# Patient Record
Sex: Male | Born: 2000 | Hispanic: No | Marital: Single | Smoking: Never smoker
Health system: Southern US, Community
[De-identification: ages and names within clinical notes are randomized; demographics above are authoritative.]

## PROBLEM LIST (undated history)

## (undated) DIAGNOSIS — F419 Anxiety disorder, unspecified: Secondary | ICD-10-CM

## (undated) DIAGNOSIS — Z87442 Personal history of urinary calculi: Secondary | ICD-10-CM

## (undated) HISTORY — PX: WISDOM TOOTH EXTRACTION: SHX21

## (undated) HISTORY — PX: HERNIA REPAIR: SHX51

---
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
## 2019-02-13 DIAGNOSIS — Z20828 Contact with and (suspected) exposure to other viral communicable diseases: Secondary | ICD-10-CM | POA: Diagnosis not present

## 2019-02-21 DIAGNOSIS — Z23 Encounter for immunization: Secondary | ICD-10-CM | POA: Diagnosis not present

## 2019-04-27 DIAGNOSIS — J019 Acute sinusitis, unspecified: Secondary | ICD-10-CM | POA: Diagnosis not present

## 2019-04-27 DIAGNOSIS — H6123 Impacted cerumen, bilateral: Secondary | ICD-10-CM | POA: Diagnosis not present

## 2019-04-27 DIAGNOSIS — Z20828 Contact with and (suspected) exposure to other viral communicable diseases: Secondary | ICD-10-CM | POA: Diagnosis not present

## 2019-06-30 ENCOUNTER — Other Ambulatory Visit: Payer: Self-pay

## 2019-06-30 DIAGNOSIS — Z20822 Contact with and (suspected) exposure to covid-19: Secondary | ICD-10-CM

## 2019-07-03 LAB — NOVEL CORONAVIRUS, NAA: SARS-CoV-2, NAA: NOT DETECTED

## 2019-08-14 DIAGNOSIS — Z20828 Contact with and (suspected) exposure to other viral communicable diseases: Secondary | ICD-10-CM | POA: Diagnosis not present

## 2019-08-16 DIAGNOSIS — Z20828 Contact with and (suspected) exposure to other viral communicable diseases: Secondary | ICD-10-CM | POA: Diagnosis not present

## 2019-12-25 DIAGNOSIS — J40 Bronchitis, not specified as acute or chronic: Secondary | ICD-10-CM | POA: Diagnosis not present

## 2019-12-25 DIAGNOSIS — J329 Chronic sinusitis, unspecified: Secondary | ICD-10-CM | POA: Diagnosis not present

## 2020-01-24 DIAGNOSIS — Z20822 Contact with and (suspected) exposure to covid-19: Secondary | ICD-10-CM | POA: Diagnosis not present

## 2020-04-09 DIAGNOSIS — Z0000 Encounter for general adult medical examination without abnormal findings: Secondary | ICD-10-CM | POA: Diagnosis not present

## 2020-04-29 DIAGNOSIS — J310 Chronic rhinitis: Secondary | ICD-10-CM | POA: Diagnosis not present

## 2020-04-29 DIAGNOSIS — H6123 Impacted cerumen, bilateral: Secondary | ICD-10-CM | POA: Diagnosis not present

## 2020-04-29 DIAGNOSIS — J353 Hypertrophy of tonsils with hypertrophy of adenoids: Secondary | ICD-10-CM | POA: Diagnosis not present

## 2020-04-29 DIAGNOSIS — J343 Hypertrophy of nasal turbinates: Secondary | ICD-10-CM | POA: Diagnosis not present

## 2020-04-29 DIAGNOSIS — J320 Chronic maxillary sinusitis: Secondary | ICD-10-CM | POA: Diagnosis not present

## 2020-06-24 ENCOUNTER — Other Ambulatory Visit: Payer: Self-pay | Admitting: Otolaryngology

## 2020-06-24 DIAGNOSIS — J351 Hypertrophy of tonsils: Secondary | ICD-10-CM | POA: Diagnosis not present

## 2020-06-24 DIAGNOSIS — R5383 Other fatigue: Secondary | ICD-10-CM | POA: Diagnosis not present

## 2020-06-24 DIAGNOSIS — R7989 Other specified abnormal findings of blood chemistry: Secondary | ICD-10-CM | POA: Diagnosis not present

## 2020-06-24 DIAGNOSIS — J3503 Chronic tonsillitis and adenoiditis: Secondary | ICD-10-CM | POA: Diagnosis not present

## 2020-06-24 DIAGNOSIS — J353 Hypertrophy of tonsils with hypertrophy of adenoids: Secondary | ICD-10-CM | POA: Diagnosis not present

## 2020-07-01 ENCOUNTER — Other Ambulatory Visit: Payer: Self-pay

## 2020-07-01 ENCOUNTER — Encounter (HOSPITAL_BASED_OUTPATIENT_CLINIC_OR_DEPARTMENT_OTHER): Payer: Self-pay | Admitting: Otolaryngology

## 2020-07-04 ENCOUNTER — Other Ambulatory Visit (HOSPITAL_COMMUNITY): Payer: BC Managed Care – PPO

## 2020-07-05 ENCOUNTER — Other Ambulatory Visit (HOSPITAL_COMMUNITY)
Admission: RE | Admit: 2020-07-05 | Discharge: 2020-07-05 | Disposition: A | Discharge status: Home / Self Care | Payer: BC Managed Care – PPO | Source: Ambulatory Visit | Attending: Otolaryngology | Admitting: Otolaryngology

## 2020-07-05 DIAGNOSIS — Z20822 Contact with and (suspected) exposure to covid-19: Secondary | ICD-10-CM | POA: Insufficient documentation

## 2020-07-05 DIAGNOSIS — Z01812 Encounter for preprocedural laboratory examination: Secondary | ICD-10-CM | POA: Insufficient documentation

## 2020-07-05 DIAGNOSIS — J3501 Chronic tonsillitis: Secondary | ICD-10-CM | POA: Diagnosis present

## 2020-07-05 DIAGNOSIS — J312 Chronic pharyngitis: Secondary | ICD-10-CM | POA: Diagnosis not present

## 2020-07-05 LAB — SARS CORONAVIRUS 2 (TAT 6-24 HRS): SARS Coronavirus 2: NEGATIVE

## 2020-07-08 ENCOUNTER — Encounter (HOSPITAL_BASED_OUTPATIENT_CLINIC_OR_DEPARTMENT_OTHER): Payer: Self-pay | Admitting: Otolaryngology

## 2020-07-08 ENCOUNTER — Encounter (HOSPITAL_BASED_OUTPATIENT_CLINIC_OR_DEPARTMENT_OTHER)
Admission: RE | Disposition: A | Discharge status: Home / Self Care | Payer: Self-pay | Source: Home / Self Care | Attending: Otolaryngology

## 2020-07-08 ENCOUNTER — Ambulatory Visit (HOSPITAL_BASED_OUTPATIENT_CLINIC_OR_DEPARTMENT_OTHER): Payer: BC Managed Care – PPO | Admitting: Anesthesiology

## 2020-07-08 ENCOUNTER — Ambulatory Visit (HOSPITAL_BASED_OUTPATIENT_CLINIC_OR_DEPARTMENT_OTHER)
Admission: RE | Admit: 2020-07-08 | Discharge: 2020-07-08 | Disposition: A | Discharge status: Home / Self Care | Payer: BC Managed Care – PPO | Attending: Otolaryngology | Admitting: Otolaryngology

## 2020-07-08 ENCOUNTER — Other Ambulatory Visit: Payer: Self-pay

## 2020-07-08 DIAGNOSIS — J312 Chronic pharyngitis: Secondary | ICD-10-CM | POA: Insufficient documentation

## 2020-07-08 DIAGNOSIS — F419 Anxiety disorder, unspecified: Secondary | ICD-10-CM | POA: Diagnosis not present

## 2020-07-08 DIAGNOSIS — J3501 Chronic tonsillitis: Secondary | ICD-10-CM | POA: Insufficient documentation

## 2020-07-08 DIAGNOSIS — J3503 Chronic tonsillitis and adenoiditis: Secondary | ICD-10-CM | POA: Diagnosis not present

## 2020-07-08 DIAGNOSIS — J353 Hypertrophy of tonsils with hypertrophy of adenoids: Secondary | ICD-10-CM | POA: Diagnosis not present

## 2020-07-08 DIAGNOSIS — Z20822 Contact with and (suspected) exposure to covid-19: Secondary | ICD-10-CM | POA: Insufficient documentation

## 2020-07-08 HISTORY — DX: Anxiety disorder, unspecified: F41.9

## 2020-07-08 HISTORY — PX: TONSILLECTOMY AND ADENOIDECTOMY: SHX28

## 2020-07-08 SURGERY — TONSILLECTOMY AND ADENOIDECTOMY
Anesthesia: General | Site: Throat | Laterality: Bilateral

## 2020-07-08 MED ORDER — PROMETHAZINE HCL 25 MG/ML IJ SOLN: 6.2500 mg | INTRAMUSCULAR | Status: DC | PRN

## 2020-07-08 MED ORDER — AMOXICILLIN 400 MG/5ML PO SUSR: 800.0000 mg | Freq: Two times a day (BID) | ORAL | 0 refills | Status: DC

## 2020-07-08 MED ORDER — DEXMEDETOMIDINE HCL 200 MCG/2ML IV SOLN
INTRAVENOUS | Status: DC | PRN
  Administered 2020-07-08: 8 ug via INTRAVENOUS
  Administered 2020-07-08: 12 ug via INTRAVENOUS

## 2020-07-08 MED ORDER — SUCCINYLCHOLINE CHLORIDE 20 MG/ML IJ SOLN
INTRAMUSCULAR | Status: DC | PRN
  Administered 2020-07-08: 160 mg via INTRAVENOUS

## 2020-07-08 MED ORDER — HYDROCODONE-ACETAMINOPHEN 7.5-325 MG/15ML PO SOLN: 15.0000 mL | ORAL | 0 refills | Status: AC | PRN

## 2020-07-08 MED ORDER — SODIUM CHLORIDE 0.9 % IR SOLN
Status: DC | PRN
  Administered 2020-07-08: 500 mL

## 2020-07-08 MED ORDER — LACTATED RINGERS IV SOLN: INTRAVENOUS | Status: DC

## 2020-07-08 MED ORDER — 0.9 % SODIUM CHLORIDE (POUR BTL) OPTIME
TOPICAL | Status: DC | PRN
  Administered 2020-07-08: 1000 mL

## 2020-07-08 MED ORDER — ONDANSETRON HCL 4 MG/2ML IJ SOLN
INTRAMUSCULAR | Status: DC | PRN
  Administered 2020-07-08: 4 mg via INTRAVENOUS

## 2020-07-08 MED ORDER — LIDOCAINE HCL (CARDIAC) PF 100 MG/5ML IV SOSY
PREFILLED_SYRINGE | INTRAVENOUS | Status: DC | PRN
  Administered 2020-07-08: 60 mg via INTRAVENOUS

## 2020-07-08 MED ORDER — MEPERIDINE HCL 25 MG/ML IJ SOLN: 6.2500 mg | INTRAMUSCULAR | Status: DC | PRN

## 2020-07-08 MED ORDER — MIDAZOLAM HCL 2 MG/2ML IJ SOLN
INTRAMUSCULAR | Status: AC
  Filled 2020-07-08: qty 2

## 2020-07-08 MED ORDER — DEXAMETHASONE SODIUM PHOSPHATE 4 MG/ML IJ SOLN
INTRAMUSCULAR | Status: DC | PRN
  Administered 2020-07-08: 10 mg via INTRAVENOUS

## 2020-07-08 MED ORDER — HYDROMORPHONE HCL 1 MG/ML IJ SOLN: 0.2500 mg | INTRAMUSCULAR | Status: DC | PRN

## 2020-07-08 MED ORDER — HYDROCODONE-ACETAMINOPHEN 7.5-325 MG/15ML PO SOLN: 15.0000 mL | ORAL | 0 refills | Status: DC | PRN

## 2020-07-08 MED ORDER — ROCURONIUM BROMIDE 100 MG/10ML IV SOLN
INTRAVENOUS | Status: DC | PRN
  Administered 2020-07-08: 10 mg via INTRAVENOUS

## 2020-07-08 MED ORDER — AMISULPRIDE (ANTIEMETIC) 5 MG/2ML IV SOLN: 10.0000 mg | Freq: Once | INTRAVENOUS | Status: DC | PRN

## 2020-07-08 MED ORDER — MIDAZOLAM HCL 5 MG/5ML IJ SOLN
INTRAMUSCULAR | Status: DC | PRN
  Administered 2020-07-08: 2 mg via INTRAVENOUS

## 2020-07-08 MED ORDER — OXYMETAZOLINE HCL 0.05 % NA SOLN
NASAL | Status: DC | PRN
  Administered 2020-07-08: 1 via TOPICAL

## 2020-07-08 MED ORDER — AMOXICILLIN 400 MG/5ML PO SUSR: 800.0000 mg | Freq: Two times a day (BID) | ORAL | 0 refills | Status: AC

## 2020-07-08 MED ORDER — OXYCODONE HCL 5 MG/5ML PO SOLN: 5.0000 mg | Freq: Once | ORAL | Status: DC | PRN

## 2020-07-08 MED ORDER — LIDOCAINE 2% (20 MG/ML) 5 ML SYRINGE
INTRAMUSCULAR | Status: AC
  Filled 2020-07-08: qty 5

## 2020-07-08 MED ORDER — PROPOFOL 10 MG/ML IV BOLUS
INTRAVENOUS | Status: AC
  Filled 2020-07-08: qty 20

## 2020-07-08 MED ORDER — DEXMEDETOMIDINE (PRECEDEX) IN NS 20 MCG/5ML (4 MCG/ML) IV SYRINGE
PREFILLED_SYRINGE | INTRAVENOUS | Status: AC
  Filled 2020-07-08: qty 5

## 2020-07-08 MED ORDER — OXYCODONE HCL 5 MG PO TABS: 5.0000 mg | ORAL_TABLET | Freq: Once | ORAL | Status: DC | PRN

## 2020-07-08 MED ORDER — FENTANYL CITRATE (PF) 100 MCG/2ML IJ SOLN
INTRAMUSCULAR | Status: DC | PRN
  Administered 2020-07-08: 100 ug via INTRAVENOUS

## 2020-07-08 MED ORDER — PROPOFOL 10 MG/ML IV BOLUS
INTRAVENOUS | Status: DC | PRN
  Administered 2020-07-08: 150 mg via INTRAVENOUS

## 2020-07-08 MED ORDER — FENTANYL CITRATE (PF) 100 MCG/2ML IJ SOLN
INTRAMUSCULAR | Status: AC
  Filled 2020-07-08: qty 2

## 2020-07-08 SURGICAL SUPPLY — 29 items
BNDG COHESIVE 2X5 TAN STRL LF (GAUZE/BANDAGES/DRESSINGS) IMPLANT
CANISTER SUCT 1200ML W/VALVE (MISCELLANEOUS) ×2 IMPLANT
CATH ROBINSON RED A/P 10FR (CATHETERS) IMPLANT
CATH ROBINSON RED A/P 14FR (CATHETERS) IMPLANT
COAGULATOR SUCT SWTCH 10FR 6 (ELECTROSURGICAL) IMPLANT
COVER BACK TABLE 60X90IN (DRAPES) ×2 IMPLANT
COVER MAYO STAND STRL (DRAPES) ×2 IMPLANT
COVER WAND RF STERILE (DRAPES) IMPLANT
ELECT REM PT RETURN 9FT ADLT (ELECTROSURGICAL)
ELECT REM PT RETURN 9FT PED (ELECTROSURGICAL)
ELECTRODE REM PT RETRN 9FT PED (ELECTROSURGICAL) IMPLANT
ELECTRODE REM PT RTRN 9FT ADLT (ELECTROSURGICAL) IMPLANT
GAUZE SPONGE 4X4 12PLY STRL LF (GAUZE/BANDAGES/DRESSINGS) ×2 IMPLANT
GLOVE BIO SURGEON STRL SZ7.5 (GLOVE) ×2 IMPLANT
GOWN STRL REUS W/ TWL LRG LVL3 (GOWN DISPOSABLE) ×2 IMPLANT
GOWN STRL REUS W/TWL LRG LVL3 (GOWN DISPOSABLE) ×4
IV NS 500ML (IV SOLUTION) ×2
IV NS 500ML BAXH (IV SOLUTION) ×1 IMPLANT
MARKER SKIN DUAL TIP RULER LAB (MISCELLANEOUS) IMPLANT
NS IRRIG 1000ML POUR BTL (IV SOLUTION) ×2 IMPLANT
SHEET MEDIUM DRAPE 40X70 STRL (DRAPES) ×2 IMPLANT
SOLUTION BUTLER CLEAR DIP (MISCELLANEOUS) ×2 IMPLANT
SPONGE TONSIL TAPE 1.25 RFD (DISPOSABLE) ×2 IMPLANT
SYR BULB EAR ULCER 3OZ GRN STR (SYRINGE) IMPLANT
TOWEL GREEN STERILE FF (TOWEL DISPOSABLE) ×2 IMPLANT
TUBE CONNECTING 20X1/4 (TUBING) ×2 IMPLANT
TUBE SALEM SUMP 12R W/ARV (TUBING) IMPLANT
TUBE SALEM SUMP 16 FR W/ARV (TUBING) IMPLANT
WAND COBLATOR 70 EVAC XTRA (SURGICAL WAND) ×2 IMPLANT

## 2020-07-08 NOTE — Op Note (Signed)
DATE OF PROCEDURE:  07/08/2020                              OPERATIVE REPORT  SURGEON:  Newman Pies, MD  PREOPERATIVE DIAGNOSES: 1. Adenotonsillar hypertrophy. 2. Chronic tonsillitis and pharyngitis  POSTOPERATIVE DIAGNOSES: 1. Adenotonsillar hypertrophy. 2. Chronic tonsillitis and pharyngitis  PROCEDURE PERFORMED:  Adenotonsillectomy.  ANESTHESIA:  General endotracheal tube anesthesia.  COMPLICATIONS:  None.  ESTIMATED BLOOD LOSS:  Minimal.  INDICATION FOR PROCEDURE:  George Mcneil is a 19 y.o. male with a history of chronic tonsillitis/pharyngitis.  According to the patient, he has been experiencing chronic throat discomfort and recurrent infections for several years. The patient continued to be symptomatic despite medical treatments. On examination, the patient was noted to have bilateral cryptic tonsils, with numerous tonsilloliths. Based on the above findings, the decision was made for the patient to undergo the adenotonsillectomy procedure. Likelihood of success in reducing symptoms was also discussed.  The risks, benefits, alternatives, and details of the procedure were discussed with the patient.  Questions were invited and answered.  Informed consent was obtained.  DESCRIPTION:  The patient was taken to the operating room and placed supine on the operating table.  General endotracheal tube anesthesia was administered by the anesthesiologist.  The patient was positioned and prepped and draped in a standard fashion for adenotonsillectomy.  A Crowe-Davis mouth gag was inserted into the oral cavity for exposure. 3+ cryptic tonsils were noted bilaterally.  No bifidity was noted.  Indirect mirror examination of the nasopharynx revealed moderate adenoid hypertrophy. The adenoid was ablated with the Coblator device. Hemostasis was achieved with the Coblator device.  The right tonsil was then grasped with a straight Allis clamp and retracted medially.  It was resected free from the  underlying pharyngeal constrictor muscles with the Coblator device.  The same procedure was repeated on the left side without exception.  The surgical sites were copiously irrigated.  The mouth gag was removed.  The care of the patient was turned over to the anesthesiologist.  The patient was awakened from anesthesia without difficulty.  The patient was extubated and transferred to the recovery room in good condition.  OPERATIVE FINDINGS:  Adenotonsillar hypertrophy.  SPECIMEN:  None  FOLLOWUP CARE:  The patient will be discharged home once awake and alert.  He will be placed on amoxicillin 800 mg p.o. b.i.d. for 5 days, and hycet for postop pain control.   The patient will follow up in my office in approximately 2 weeks.  Brenya Taulbee W Calton Harshfield 07/08/2020 10:39 AM

## 2020-07-08 NOTE — Anesthesia Procedure Notes (Signed)
Procedure Name: Intubation Performed by: Taiesha Bovard M, CRNA Pre-anesthesia Checklist: Patient identified, Emergency Drugs available, Suction available and Patient being monitored Patient Re-evaluated:Patient Re-evaluated prior to induction Oxygen Delivery Method: Circle system utilized Preoxygenation: Pre-oxygenation with 100% oxygen Induction Type: IV induction Ventilation: Mask ventilation without difficulty Laryngoscope Size: Mac and 3 Grade View: Grade I Tube type: Oral Number of attempts: 1 Airway Equipment and Method: Stylet and Oral airway Placement Confirmation: ETT inserted through vocal cords under direct vision,  positive ETCO2 and breath sounds checked- equal and bilateral Secured at: 23 cm Tube secured with: Tape Dental Injury: Teeth and Oropharynx as per pre-operative assessment        

## 2020-07-08 NOTE — Discharge Instructions (Signed)
George Mcneil W[facility] M[facility] Postoperative Instructions for Tonsillectomy & Adenoidectomy (T&A) Activity Restrict activity at home for the first two days, resting as much as possible. Light indoor activity is best. You may usually return to school or work within a week but void strenuous activity and sports for two weeks. Sleep with your head elevated on 2-3 pillows for 3-4 days to help decrease swelling. Diet Due to tissue swelling and throat discomfort, you may have little desire to drink for several days. However fluids are very important to prevent dehydration. You will find that non-acidic juices, soups, popsicles, Jell-O, custard, puddings, and any soft or mashed foods taken in small quantities can be swallowed fairly easily. Try to increase your fluid and food intake as the discomfort subsides. It is recommended that a child receive 1-1/2 quarts of fluid in a 24-hour period. Adult require twice this amount.  Discomfort Your sore throat may be relieved by applying an ice collar to your neck and/or by taking Tylenol. You may experience an earache, which is due to referred pain from the throat. Referred ear pain is commonly felt at night when trying to rest.  Bleeding                        Although rare, there is risk of having some bleeding during the first 2 weeks after having a T&A. This usually happens between days 7-10 postoperatively. If you or your child should have any bleeding, try to remain calm. We recommend sitting up quietly in a chair and gently spitting out the blood into a bowl. For adults, gargling gently with ice water may help. If the bleeding does not stop after a short time (5 minutes), is more than 1 teaspoonful, or if you become worried, please call our office at [phone] or go directly to the nearest hospital emergency room. Do not eat or drink anything prior to going to the hospital as you may need to be taken to the operating room in order to control the bleeding. GENERAL  CONSIDERATIONS 1. Brush your teeth regularly. Avoid mouthwashes and gargles for three weeks. You may gargle gently with warm salt-water as necessary or spray with Chloraseptic. You may make salt-water by placing 2 teaspoons of table salt into a quart of fresh water. Warm the salt-water in a microwave to a luke warm temperature.  2. Avoid exposure to colds and upper respiratory infections if possible.  3. If you look into a mirror or into your child's mouth, you will see white-gray patches in the back of the throat. This is normal after having a T&A and is like a scab that forms on the skin after an abrasion. It will disappear once the back of the throat heals completely. However, it may cause a noticeable odor; this too will disappear with time. Again, warm salt-water gargles may be used to help keep the throat clean and promote healing.  4. You may notice a temporary change in voice quality, such as a higher pitched voice or a nasal sound, until healing is complete. This may last for 1-2 weeks and should resolve.  5. Do not take or give you child any medications that we have not prescribed or recommended.  6. Snoring may occur, especially at night, for the first week after a T&A. It is due to swelling of the soft palate and will usually resolve.  Please call our office at [phone] if you have any questions.       Post Anesthesia Home Care Instructions  Activity: Get plenty of rest for the remainder of the day. A responsible individual must stay with you for 24 hours following the procedure.  For the next 24 hours, DO NOT: -Drive a car -Operate machinery -Drink alcoholic beverages -Take any medication unless instructed by your physician -Make any legal decisions or sign important papers.  Meals: Start with liquid foods such as gelatin or soup. Progress to regular foods as tolerated. Avoid greasy, spicy, heavy foods. If nausea and/or vomiting occur, drink only clear liquids until the nausea  and/or vomiting subsides. Call your physician if vomiting continues.  Special Instructions/Symptoms: Your throat may feel dry or sore from the anesthesia or the breathing tube placed in your throat during surgery. If this causes discomfort, gargle with warm salt water. The discomfort should disappear within 24 hours.  If you had a scopolamine patch placed behind your ear for the management of post- operative nausea and/or vomiting:  1. The medication in the patch is effective for 72 hours, after which it should be removed.  Wrap patch in a tissue and discard in the trash. Wash hands thoroughly with soap and water. 2. You may remove the patch earlier than 72 hours if you experience unpleasant side effects which may include dry mouth, dizziness or visual disturbances. 3. Avoid touching the patch. Wash your hands with soap and water after contact with the patch.     

## 2020-07-08 NOTE — Anesthesia Preprocedure Evaluation (Signed)
Anesthesia Evaluation  Patient identified by MRN, date of birth, ID band Patient awake    Reviewed: Allergy & Precautions, H&P , NPO status , Patient's Chart, lab work & pertinent test results  Airway Mallampati: II  TM Distance: >3 FB Neck ROM: Full    Dental no notable dental hx.    Pulmonary neg pulmonary ROS, Patient abstained from smoking.,    Pulmonary exam normal breath sounds clear to auscultation       Cardiovascular negative cardio ROS Normal cardiovascular exam Rhythm:Regular Rate:Normal     Neuro/Psych Anxiety negative neurological ROS  negative psych ROS   GI/Hepatic negative GI ROS, Neg liver ROS,   Endo/Other  negative endocrine ROS  Renal/GU negative Renal ROS  negative genitourinary   Musculoskeletal negative musculoskeletal ROS (+)   Abdominal   Peds negative pediatric ROS (+)  Hematology negative hematology ROS (+)   Anesthesia Other Findings   Reproductive/Obstetrics negative OB ROS                             Anesthesia Physical Anesthesia Plan  ASA: II  Anesthesia Plan: General   Post-op Pain Management:    Induction: Intravenous  PONV Risk Score and Plan: 2 and Ondansetron, Midazolam and Treatment may vary due to age or medical condition  Airway Management Planned: Oral ETT  Additional Equipment:   Intra-op Plan:   Post-operative Plan: Extubation in OR  Informed Consent: I have reviewed the patients History and Physical, chart, labs and discussed the procedure including the risks, benefits and alternatives for the proposed anesthesia with the patient or authorized representative who has indicated his/her understanding and acceptance.     Dental advisory given  Plan Discussed with: CRNA  Anesthesia Plan Comments:         Anesthesia Quick Evaluation

## 2020-07-08 NOTE — Anesthesia Postprocedure Evaluation (Signed)
Anesthesia Post Note  Patient: George Mcneil  Procedure(s) Performed: TONSILLECTOMY AND ADENOIDECTOMY (Bilateral Throat)     Patient location during evaluation: PACU Anesthesia Type: General Level of consciousness: awake and alert Pain management: pain level controlled Vital Signs Assessment: post-procedure vital signs reviewed and stable Respiratory status: spontaneous breathing, nonlabored ventilation and respiratory function stable Cardiovascular status: blood pressure returned to baseline and stable Postop Assessment: no apparent nausea or vomiting Anesthetic complications: no   No complications documented.  Last Vitals:  Vitals:   07/08/20 1045 07/08/20 1100  BP: (!) 114/54 (!) 115/57  Pulse: 74 65  Resp: 16 10  Temp:    SpO2: 98% 97%    Last Pain:  Vitals:   07/08/20 1115  TempSrc:   PainSc: 0-No pain                 Lowella Curb

## 2020-07-08 NOTE — H&P (Signed)
Cc: Recurrent tonsillitis  HPI: The patient is a 19 year old male who returns today with his mother for evaluation of recurrent sore throat. The patient was last seen on 04/29/2020 with complaints of recurrent sinusitis, cough, and headaches. At that time, he was noted to have acute rhinosinusitis. Significant tonsillar hypertrophy was also noted. He was treated with a course of Levaquin. According to the patient, his sinus symptoms have improved but he has had several episodes of tonsillitis. He was treated mid November with a course of Clindamycin but now has recurrent sore throat. He was diagnosed with strep throat yesterday and prescribed Augmentin. The patient currently denies facial pain or pressure. No other ENT, GI, or respiratory issue noted since the last visit.   Exam: General: Communicates without difficulty, well nourished, no acute distress. Head: Normocephalic, no evidence injury, no tenderness, facial buttresses intact without stepoff. Face/sinus: No tenderness to palpation and percussion. Facial movement is normal and symmetric. Eyes: PERRL, EOMI. No scleral icterus, conjunctivae clear. Neuro: CN II exam reveals vision grossly intact. No nystagmus at any point of gaze. Ears:  EAC normal without erythema AU.  TM intact without fluid and mobile AU. Nose: Moist, mildly congested mucosa without lesions or mass. Mouth: Oral cavity clear and moist, no lesions, tonsils symmetric. Tonsils are 3+. Tonsils with erythema and exudate. Neck: Full range of motion, no lymphadenopathy or masses.   Assessment  1.  The patient's history and physical exam findings are consistent with chronic tonsillitis/pharyngitis secondary to adenotonsillar hypertrophy. Tonsils are 3+ with erythema and exudate.   Plan 1. The treatment options include continuing conservative observation versus adenotonsillectomy.  Based on the patient's history and physical exam findings, the patient will likely benefit from having the  tonsils and adenoid removed.  The risks, benefits, alternatives, and details of the procedure are reviewed with the patient and the parent.  Questions are invited and answered.  2. Complete his current course of Augmentin. 3. The patient and his mother are interested in proceeding with the procedure.  We will schedule the procedure in accordance with the family schedule.

## 2020-07-08 NOTE — Transfer of Care (Signed)
Immediate Anesthesia Transfer of Care Note  Patient: George Mcneil  Procedure(s) Performed: TONSILLECTOMY AND ADENOIDECTOMY (Bilateral Throat)  Patient Location: PACU  Anesthesia Type:General  Level of Consciousness: awake, alert  and oriented  Airway & Oxygen Therapy: Patient Spontanous Breathing and Patient connected to face mask oxygen  Post-op Assessment: Report given to RN and Post -op Vital signs reviewed and stable  Post vital signs: Reviewed and stable  Last Vitals:  Vitals Value Taken Time  BP    Temp    Pulse    Resp    SpO2      Last Pain:  Vitals:   07/08/20 0823  TempSrc: Oral         Complications: No complications documented.

## 2020-07-10 ENCOUNTER — Encounter (HOSPITAL_BASED_OUTPATIENT_CLINIC_OR_DEPARTMENT_OTHER): Payer: Self-pay | Admitting: Otolaryngology

## 2020-07-16 DIAGNOSIS — I788 Other diseases of capillaries: Secondary | ICD-10-CM | POA: Diagnosis not present

## 2020-07-16 DIAGNOSIS — B360 Pityriasis versicolor: Secondary | ICD-10-CM | POA: Diagnosis not present

## 2020-07-16 DIAGNOSIS — D224 Melanocytic nevi of scalp and neck: Secondary | ICD-10-CM | POA: Diagnosis not present

## 2020-10-01 DIAGNOSIS — J399 Disease of upper respiratory tract, unspecified: Secondary | ICD-10-CM | POA: Diagnosis not present

## 2020-10-09 DIAGNOSIS — R059 Cough, unspecified: Secondary | ICD-10-CM | POA: Diagnosis not present

## 2020-10-10 DIAGNOSIS — J0100 Acute maxillary sinusitis, unspecified: Secondary | ICD-10-CM | POA: Diagnosis not present

## 2020-10-10 DIAGNOSIS — J208 Acute bronchitis due to other specified organisms: Secondary | ICD-10-CM | POA: Diagnosis not present

## 2021-01-30 ENCOUNTER — Encounter (HOSPITAL_BASED_OUTPATIENT_CLINIC_OR_DEPARTMENT_OTHER): Payer: Self-pay

## 2021-01-30 ENCOUNTER — Emergency Department (HOSPITAL_BASED_OUTPATIENT_CLINIC_OR_DEPARTMENT_OTHER)
Admission: EM | Admit: 2021-01-30 | Discharge: 2021-01-31 | Disposition: A | Discharge status: Home / Self Care | Payer: BC Managed Care – PPO | Attending: Emergency Medicine | Admitting: Emergency Medicine

## 2021-01-30 ENCOUNTER — Other Ambulatory Visit: Payer: Self-pay

## 2021-01-30 DIAGNOSIS — X58XXXA Exposure to other specified factors, initial encounter: Secondary | ICD-10-CM | POA: Insufficient documentation

## 2021-01-30 DIAGNOSIS — T63481A Toxic effect of venom of other arthropod, accidental (unintentional), initial encounter: Secondary | ICD-10-CM | POA: Insufficient documentation

## 2021-01-30 DIAGNOSIS — R2231 Localized swelling, mass and lump, right upper limb: Secondary | ICD-10-CM | POA: Diagnosis not present

## 2021-01-30 NOTE — ED Triage Notes (Signed)
Patient here POV from Home with Arm Swelling (Right).  Patient was stung by an insect yesterday. Arm was swollen yesterday and since the swelling has gotten better with Veclam and Benadryl.   Obvious Swelling present to Proximal Forearm.   Ambulatory, GCS 15. Mild Tenderness.

## 2021-01-31 MED ORDER — PREDNISONE 20 MG PO TABS: 40.0000 mg | ORAL_TABLET | Freq: Every day | ORAL | 0 refills | Status: DC

## 2021-01-31 MED ORDER — PREDNISONE 20 MG PO TABS
40.0000 mg | ORAL_TABLET | Freq: Once | ORAL | Status: AC
  Administered 2021-01-31: 40 mg via ORAL
  Filled 2021-01-31: qty 2

## 2021-01-31 MED ORDER — EPINEPHRINE 0.3 MG/0.3ML IJ SOAJ: 0.3000 mg | INTRAMUSCULAR | 0 refills | Status: DC | PRN

## 2021-01-31 NOTE — ED Provider Notes (Signed)
[facility] EMERGENCY DEPT Provider Note   CSN: [id number] Arrival date & time: 01/30/21  2005     History Chief Complaint  Patient presents with   Arm Swelling    Right    George Mcneil is a 20 y.o. male.  HPI Patient presents with reaction to insect sting.  States yesterday had a bite or sting on his right elbow area.  It was rather swollen at the time.  Now more swelling diffusely on the right elbow area.  The amount had gone down but now it spread out more.  Some mild erythema.  No numbness or weakness.  No difficulty breathing.  No known allergy to insects.    Past Medical History:  Diagnosis Date   Anxiety     There are no problems to display for this patient.   Past Surgical History:  Procedure Laterality Date   TONSILLECTOMY AND ADENOIDECTOMY Bilateral 07/08/2020   Procedure: TONSILLECTOMY AND ADENOIDECTOMY;  Surgeon: Newman Pies, MD;  Location: [facility];  Service: ENT;  Laterality: Bilateral;   WISDOM TOOTH EXTRACTION         No family history on file.  Social History   Tobacco Use   Smoking status: Never   Smokeless tobacco: Never  Vaping Use   Vaping Use: Every day  Substance Use Topics   Alcohol use: Yes    Comment: weekends/socially   Drug use: Yes    Types: Marijuana    Comment: last smoked three months    Home Medications Prior to Admission medications   Medication Sig Start Date End Date Taking? Authorizing Provider  EPINEPHrine 0.3 mg/0.3 mL IJ SOAJ injection Inject 0.3 mg into the muscle as needed for anaphylaxis. 01/31/21  Yes Benjiman Core, MD  predniSONE (DELTASONE) 20 MG tablet Take 2 tablets (40 mg total) by mouth daily. 01/31/21  Yes Benjiman Core, MD  acetaminophen (TYLENOL) 500 MG tablet Take 500 mg by mouth every 6 (six) hours as needed.    [provider]  clindamycin (CLEOCIN) 300 MG capsule Take 300 mg by mouth 4 (four) times daily.    [provider]    Allergies     Patient has no known allergies.  Review of Systems   Review of Systems  Constitutional:  Negative for appetite change.  HENT:  Negative for congestion.   Respiratory:  Negative for shortness of breath and wheezing.   Cardiovascular:  Negative for chest pain.  Gastrointestinal:  Negative for abdominal pain.  Musculoskeletal:        Right elbow swelling.  Skin:  Positive for color change.  Neurological:  Negative for weakness and numbness.  Psychiatric/Behavioral:  Negative for confusion.    Physical Exam Updated Vital Signs BP (!) 145/83 (BP Location: Right Arm)   Pulse 77   Temp 99.1 F (37.3 C) (Oral)   Resp 16   Ht 5\' 10"  (1.778 m)   Wt 90.7 kg   SpO2 100%   BMI 28.70 kg/m   Physical Exam Vitals and nursing note reviewed.  HENT:     Head: Atraumatic.  Cardiovascular:     Rate and Rhythm: Regular rhythm.  Pulmonary:     Breath sounds: No wheezing, rhonchi or rales.  Abdominal:     Tenderness: There is no abdominal tenderness.  Musculoskeletal:     Cervical back: Neck supple.     Comments: Some edema of right elbow and forearm.  Good range of motion.  Slight erythema but no induration.  Neurovascular intact in hand.  Skin:    Capillary Refill: Capillary refill takes less than 2 seconds.     Findings: Erythema present.  Neurological:     Mental Status: He is alert and oriented to person, place, and time.    ED Results / Procedures / Treatments   Labs (all labs ordered are listed, but only abnormal results are displayed) Labs Reviewed - No data to display  EKG None  Radiology No results found.  Procedures Procedures   Medications Ordered in ED Medications  predniSONE (DELTASONE) tablet 40 mg (has no administration in time range)    ED Course  I have reviewed the triage vital signs and the nursing notes.  Pertinent labs & imaging results that were available during my care of the patient were reviewed by me and considered in my medical decision  making (see chart for details).    MDM Rules/Calculators/A&P                          Patient with likely reaction to insect sting.  Potentially allergic reaction.  However just localized to arm.  Will give 2 days of steroids.  No previous known allergic reaction to stings but since this is now here we will give EpiPen in case the next exposure is worse.  Appears stable for discharge home.  Does not appear to be infection.  Patient is on oral antibiotics for respiratory symptomatic Final Clinical Impression(s) / ED Diagnoses Final diagnoses:  Allergic reaction to insect sting, accidental or unintentional, initial encounter    Rx / DC Orders ED Discharge Orders          Ordered    predniSONE (DELTASONE) 20 MG tablet  Daily        01/31/21 0024    EPINEPHrine 0.3 mg/0.3 mL IJ SOAJ injection  As needed        01/31/21 0024             Benjiman Core, MD 01/31/21 [phone]

## 2021-04-03 DIAGNOSIS — J09X2 Influenza due to identified novel influenza A virus with other respiratory manifestations: Secondary | ICD-10-CM | POA: Diagnosis not present

## 2021-09-30 DIAGNOSIS — L649 Androgenic alopecia, unspecified: Secondary | ICD-10-CM | POA: Diagnosis not present

## 2022-01-22 DIAGNOSIS — R058 Other specified cough: Secondary | ICD-10-CM | POA: Diagnosis not present

## 2022-06-02 DIAGNOSIS — H903 Sensorineural hearing loss, bilateral: Secondary | ICD-10-CM | POA: Diagnosis not present

## 2022-06-02 DIAGNOSIS — J310 Chronic rhinitis: Secondary | ICD-10-CM | POA: Diagnosis not present

## 2022-06-02 DIAGNOSIS — H6123 Impacted cerumen, bilateral: Secondary | ICD-10-CM | POA: Diagnosis not present

## 2022-07-28 DIAGNOSIS — R053 Chronic cough: Secondary | ICD-10-CM | POA: Diagnosis not present

## 2022-07-28 DIAGNOSIS — K589 Irritable bowel syndrome without diarrhea: Secondary | ICD-10-CM | POA: Diagnosis not present

## 2022-10-01 ENCOUNTER — Other Ambulatory Visit (HOSPITAL_COMMUNITY): Payer: Self-pay | Admitting: Internal Medicine

## 2022-10-01 DIAGNOSIS — N50811 Right testicular pain: Secondary | ICD-10-CM

## 2022-10-01 DIAGNOSIS — N5089 Other specified disorders of the male genital organs: Secondary | ICD-10-CM | POA: Diagnosis not present

## 2022-10-01 DIAGNOSIS — N503 Cyst of epididymis: Secondary | ICD-10-CM

## 2022-10-01 DIAGNOSIS — E663 Overweight: Secondary | ICD-10-CM | POA: Diagnosis not present

## 2022-10-02 ENCOUNTER — Ambulatory Visit (HOSPITAL_COMMUNITY)
Admission: RE | Admit: 2022-10-02 | Discharge: 2022-10-02 | Disposition: A | Discharge status: Home / Self Care | Payer: BC Managed Care – PPO | Source: Ambulatory Visit | Attending: Internal Medicine | Admitting: Internal Medicine

## 2022-10-02 DIAGNOSIS — N50811 Right testicular pain: Secondary | ICD-10-CM | POA: Insufficient documentation

## 2022-10-02 DIAGNOSIS — N503 Cyst of epididymis: Secondary | ICD-10-CM | POA: Diagnosis not present

## 2022-10-02 DIAGNOSIS — I861 Scrotal varices: Secondary | ICD-10-CM | POA: Diagnosis not present

## 2023-02-04 DIAGNOSIS — K429 Umbilical hernia without obstruction or gangrene: Secondary | ICD-10-CM | POA: Diagnosis not present

## 2023-02-09 ENCOUNTER — Other Ambulatory Visit: Payer: Self-pay | Admitting: General Surgery

## 2023-02-09 DIAGNOSIS — R1033 Periumbilical pain: Secondary | ICD-10-CM

## 2023-02-09 DIAGNOSIS — R1030 Lower abdominal pain, unspecified: Secondary | ICD-10-CM

## 2023-02-12 DIAGNOSIS — N50811 Right testicular pain: Secondary | ICD-10-CM | POA: Diagnosis not present

## 2023-02-12 DIAGNOSIS — E611 Iron deficiency: Secondary | ICD-10-CM | POA: Diagnosis not present

## 2023-02-12 DIAGNOSIS — R5383 Other fatigue: Secondary | ICD-10-CM | POA: Diagnosis not present

## 2023-02-12 DIAGNOSIS — R7989 Other specified abnormal findings of blood chemistry: Secondary | ICD-10-CM | POA: Diagnosis not present

## 2023-02-16 DIAGNOSIS — Z1331 Encounter for screening for depression: Secondary | ICD-10-CM | POA: Diagnosis not present

## 2023-02-16 DIAGNOSIS — Z23 Encounter for immunization: Secondary | ICD-10-CM | POA: Diagnosis not present

## 2023-02-16 DIAGNOSIS — Z0000 Encounter for general adult medical examination without abnormal findings: Secondary | ICD-10-CM | POA: Diagnosis not present

## 2023-03-03 ENCOUNTER — Other Ambulatory Visit: Payer: BC Managed Care – PPO

## 2023-06-08 ENCOUNTER — Ambulatory Visit (INDEPENDENT_AMBULATORY_CARE_PROVIDER_SITE_OTHER): Payer: BC Managed Care – PPO

## 2023-07-01 ENCOUNTER — Encounter (INDEPENDENT_AMBULATORY_CARE_PROVIDER_SITE_OTHER): Payer: Self-pay

## 2023-07-01 ENCOUNTER — Ambulatory Visit (INDEPENDENT_AMBULATORY_CARE_PROVIDER_SITE_OTHER): Payer: BC Managed Care – PPO | Admitting: Otolaryngology

## 2023-07-01 VITALS — Ht 70.0 in | Wt 213.0 lb

## 2023-07-01 DIAGNOSIS — J343 Hypertrophy of nasal turbinates: Secondary | ICD-10-CM

## 2023-07-01 DIAGNOSIS — H6123 Impacted cerumen, bilateral: Secondary | ICD-10-CM

## 2023-07-01 DIAGNOSIS — R0982 Postnasal drip: Secondary | ICD-10-CM

## 2023-07-01 DIAGNOSIS — J310 Chronic rhinitis: Secondary | ICD-10-CM

## 2023-07-01 DIAGNOSIS — J309 Allergic rhinitis, unspecified: Secondary | ICD-10-CM | POA: Insufficient documentation

## 2023-07-01 DIAGNOSIS — R0981 Nasal congestion: Secondary | ICD-10-CM | POA: Diagnosis not present

## 2023-07-01 DIAGNOSIS — J3089 Other allergic rhinitis: Secondary | ICD-10-CM

## 2023-07-01 NOTE — Progress Notes (Signed)
Patient ID: George Mcneil, male   DOB: [birth year], 22 y.o.   MRN: [id number]

## 2023-07-03 NOTE — Progress Notes (Signed)
Patient ID: George Mcneil, male   DOB: 11-Mar-2001, 22 y.o.   MRN: [id number]  CC: Chronic nasal congestion, postnasal drainage  HPI: The patient is a 22 year old male who presents today complaining of chronic nasal congestion and frequent postnasal drainage.  He has resulted in occasional coughing spells.  He has self treated with Mucinex, with minimal improvement in his symptoms.  The patient was last seen 1 year ago.  At that time, he was noted to have chronic rhinitis and mild nasal mucosal congestion.  He was placed on Flonase nasal spray.  The patient denies any significant facial pain, fever, or visual change.  Exam: General: Communicates without difficulty, well nourished, no acute distress. Head: Normocephalic, no evidence injury, no tenderness, facial buttresses intact without stepoff. Face/sinus: No tenderness to palpation and percussion. Facial movement is normal and symmetric. Eyes: PERRL, EOMI. No scleral icterus, conjunctivae clear. Neuro: CN II exam reveals vision grossly intact.  No nystagmus at any point of gaze. EAC: Bilateral cerumen impaction.  Under the operating microscope, the cerumen is carefully removed with a combination of cerumen currette, alligator forceps, and suction catheters.  After the cerumen is removed, the TMs are noted to be normal. Nose: External evaluation reveals normal support and skin without lesions.  Dorsum is intact.  Anterior rhinoscopy reveals congested mucosa over anterior aspect of inferior turbinates and intact septum.  No purulence noted. Oral:  Oral cavity and oropharynx are intact, symmetric, without erythema or edema.  Mucosa is moist without lesions. Neck: Full range of motion without pain.  There is no significant lymphadenopathy.  No masses palpable.  Thyroid bed within normal limits to palpation.  Parotid glands and submandibular glands equal bilaterally without mass.  Trachea is midline. Neuro:  CN 2-12 grossly intact. Gait normal.    Assessment: 1.  Bilateral cerumen impaction.  After the disimpaction procedure, both tympanic membranes and middle ear spaces are noted to be normal. 2.  Chronic rhinitis with nasal mucosal congestion and bilateral inferior turbinate hypertrophy. 3.  Chronic postnasal drainage  Plan: 1.  Otomicroscopy with bilateral cerumen disimpaction. 2.  Continue with Flonase nasal spray 2 sprays each nostril daily. 3.  Atrovent nasal spray as needed to treat the chronic postnasal drainage. 4.  Allergy referral and evaluation. 5.  The patient will return for reevaluation in 6 months, sooner if needed.

## 2023-07-05 DIAGNOSIS — K439 Ventral hernia without obstruction or gangrene: Secondary | ICD-10-CM | POA: Diagnosis not present

## 2023-07-07 DIAGNOSIS — K439 Ventral hernia without obstruction or gangrene: Secondary | ICD-10-CM | POA: Diagnosis not present

## 2023-07-26 NOTE — Progress Notes (Signed)
 New Patient Note  RE: George Mcneil MRN: [id number] DOB: 02-26-2001 Date of Office Visit: 07/27/2023  Consult requested by: Karis Clunes, MD Primary care provider: Valentin Skates, DO  Chief Complaint: Nasal Congestion  History of Present Illness: I had the pleasure of seeing George Mcneil for initial evaluation at the [facility] on 07/27/2023. He is a 22 y.o. male, who is referred here by Dr. Karis (ENT) for the evaluation of environmental allergies.  Discussed the use of AI scribe software for clinical note transcription with the patient, who gave verbal consent to proceed.  The patient was referred by Dr. Theo, an ENT specialist, for evaluation of chronic congestion, runny nose, and intermittent cough persisting for several years. He reports occasional sneezing, nasal itchiness, and itchy eyes. The patient has tried Zyrtec and nasal sprays, including Flonase and Nasonex, with partial relief of symptoms. However, the cough, which he describes as originating from chest congestion, persists. The patient also reports occasional headaches and has a history of sinus infections requiring antibiotics, the most recent episode being two years ago.  The patient has not had any allergy  testing or sinus surgeries in the past. He reports occasional heartburn or reflux, for which he has taken over-the-counter medication, possibly GalvaScan. The patient works in [employer] and has noticed a recurrent rash on his hand after contact with certain types of flour but no issues with wheat ingestion.   The patient recently acquired a cat and has noticed an increase in sniffles since then, although he is unsure if this is related to the cat or seasonal changes. He has also had a German Shepherd for seven years.      He reports symptoms of nasal congestion, rhinorrhea, coughing, some sneezing/itchy nose, itchy/watery eyes. Symptoms have been going on for few years. The symptoms are  present all year around with worsening in winter. Other triggers include exposure to unknown. Anosmia: sometimes. Headache: yes. He has used zyrtec, Flonase, Nasonex with some improvement in symptoms. Sinus infections: 2 years ago. Previous work up includes: none. Previous ENT evaluation: yes, no prior sinus surgery Previous sinus imaging: no. History of nasal polyps: no. Last eye exam: 3-4 years ago. History of reflux: sometimes and takes some type of OTC meds.   07/01/2023 ENT visit: Assessment: 1.  Bilateral cerumen impaction.  After the disimpaction procedure, both tympanic membranes and middle ear spaces are noted to be normal. 2.  Chronic rhinitis with nasal mucosal congestion and bilateral inferior turbinate hypertrophy. 3.  Chronic postnasal drainage   Plan: 1.  Otomicroscopy with bilateral cerumen disimpaction. 2.  Continue with Flonase nasal spray 2 sprays each nostril daily. 3.  Atrovent nasal spray as needed to treat the chronic postnasal drainage. 4.  Allergy  referral and evaluation. 5.  The patient will return for reevaluation in 6 months, sooner if needed.  Assessment and Plan: George Mcneil is a 23 y.o. male with: Other allergic rhinitis Allergic conjunctivitis of both eyes Persistent symptoms of congestion, runny nose, occasional sneezing, and itchy nose. Symptoms are year-round with possible seasonal exacerbation in winter. Partial response to Zyrtec and Flonase. Follows with ENT. Return for allergy  skin testing. Will make additional recommendations based on results.  Chronic coughing On and off cough for several years, sometimes associated with chest congestion. No clear triggers identified. No prior asthma diagnosis or inhaler use.  Today's spirometry was normal.  Monitor symptoms.   Return for Skin testing.  No orders of the defined types  were placed in this encounter.  Lab Orders  No laboratory test(s) ordered today    Other allergy  screening: Asthma:   no Coughing and chest tightness. No prior inhaler use.  Food allergy : no Medication allergy : no Hymenoptera allergy : no Urticaria: no Eczema:no Flour contact rash? But tolerates gluten products with no issues.  History of recurrent infections suggestive of immunodeficency: no  Diagnostics: Spirometry:  Tracings reviewed. His effort: Good reproducible efforts. FVC: 5.91L FEV1: 5.01L, 106% predicted FEV1/FVC ratio: 85% Interpretation: Spirometry consistent with normal pattern.  Please see scanned spirometry results for details.  Results discussed with patient/family.   Past Medical History: Patient Active Problem List   Diagnosis Date Noted   Allergic rhinitis 07/01/2023   Impacted cerumen of both ears 07/01/2023   Postnasal drip 07/01/2023   Hypertrophy of nasal turbinates 07/01/2023   Past Medical History:  Diagnosis Date   Anxiety    Past Surgical History: Past Surgical History:  Procedure Laterality Date   HERNIA REPAIR     TONSILLECTOMY AND ADENOIDECTOMY Bilateral 07/08/2020   Procedure: TONSILLECTOMY AND ADENOIDECTOMY;  Surgeon: Karis Clunes, MD;  Location: [facility];  Service: ENT;  Laterality: Bilateral;   WISDOM TOOTH EXTRACTION     Medication List:  No current outpatient medications on file.   No current facility-administered medications for this visit.   Allergies: No Known Allergies Social History: Social History   Socioeconomic History   Marital status: Single    Spouse name: Not on file   Number of children: Not on file   Years of education: Not on file   Highest education level: Not on file  Occupational History   Not on file  Tobacco Use   Smoking status: Never    Passive exposure: Never   Smokeless tobacco: Never  Vaping Use   Vaping status: Former  Substance and Sexual Activity   Alcohol use: Yes    Comment: weekends/socially   Drug use: Yes    Types: Marijuana    Comment: last smoked three months   Sexual activity:  Not on file  Other Topics Concern   Not on file  Social History Narrative   Not on file   Social Drivers of Health   Financial Resource Strain: Not on file  Food Insecurity: Not on file  Transportation Needs: Not on file  Physical Activity: Not on file  Stress: Not on file  Social Connections: Unknown (12/02/2021)   Received from [facility]   Social Network    Social Network: Not on file   Lives in a house. Smoking: denies Occupation: works in surveyor, mining at [employer].   Environmental History: Water Damage/mildew in the house: no Carpet in the family room: no Carpet in the bedroom: yes Heating: not sure Cooling: central Pet: yes 1 cat x 1 month , 1 dog x 7 yrs  Family History: Family History  Problem Relation Age of Onset   Allergic rhinitis Neg Hx    Angioedema Neg Hx    Asthma Neg Hx    Atopy Neg Hx    Eczema Neg Hx    Immunodeficiency Neg Hx    Urticaria Neg Hx    Review of Systems  Constitutional:  Negative for appetite change, chills, fever and unexpected weight change.  HENT:  Positive for congestion and rhinorrhea.   Eyes:  Negative for itching.  Respiratory:  Positive for cough. Negative for chest tightness, shortness of breath and wheezing.   Cardiovascular:  Negative for chest pain.  Gastrointestinal:  Negative for abdominal pain.  Genitourinary:  Negative for difficulty urinating.  Skin:  Negative for rash.  Neurological:  Negative for headaches.    Objective: BP 92/62   Pulse 81   Temp 98.1 F (36.7 C) (Temporal)   Resp 18   Ht 5' 9.5 (1.765 m)   Wt 213 lb (96.6 kg)   SpO2 98%   BMI 31.00 kg/m  Body mass index is 31 kg/m. Physical Exam Vitals and nursing note reviewed.  Constitutional:      Appearance: Normal appearance. He is well-developed.  HENT:     Head: Normocephalic and atraumatic.     Right Ear: Tympanic membrane and external ear normal.     Left Ear: Tympanic membrane and external ear normal.     Nose:  Nose normal.     Mouth/Throat:     Mouth: Mucous membranes are moist.     Pharynx: Oropharynx is clear.  Eyes:     Conjunctiva/sclera: Conjunctivae normal.  Cardiovascular:     Rate and Rhythm: Normal rate and regular rhythm.     Heart sounds: Normal heart sounds. No murmur heard.    No friction rub. No gallop.  Pulmonary:     Effort: Pulmonary effort is normal.     Breath sounds: Normal breath sounds. No wheezing, rhonchi or rales.  Musculoskeletal:     Cervical back: Neck supple.  Skin:    General: Skin is warm.     Findings: No rash.  Neurological:     Mental Status: He is alert and oriented to person, place, and time.  Psychiatric:        Behavior: Behavior normal.   The plan was reviewed with the patient/family, and all questions/concerned were addressed.  It was my pleasure to see George Mcneil today and participate in his care. Please feel free to contact me with any questions or concerns.  Sincerely,  Orlan Cramp, DO Allergy  & Immunology  [facility]  [place] office: [phone] [facility] office: [phone]

## 2023-07-27 ENCOUNTER — Encounter: Payer: Self-pay | Admitting: Allergy

## 2023-07-27 ENCOUNTER — Ambulatory Visit: Payer: BC Managed Care – PPO | Admitting: Allergy

## 2023-07-27 VITALS — BP 92/62 | HR 81 | Temp 98.1°F | Resp 18 | Ht 69.5 in | Wt 213.0 lb

## 2023-07-27 DIAGNOSIS — R053 Chronic cough: Secondary | ICD-10-CM | POA: Diagnosis not present

## 2023-07-27 DIAGNOSIS — H1013 Acute atopic conjunctivitis, bilateral: Secondary | ICD-10-CM | POA: Diagnosis not present

## 2023-07-27 DIAGNOSIS — J3089 Other allergic rhinitis: Secondary | ICD-10-CM | POA: Diagnosis not present

## 2023-07-27 NOTE — Patient Instructions (Addendum)
 Rhinitis  Return for allergy  skin testing. Will make additional recommendations based on results. Make sure you don't take any antihistamines for 3 days before the skin testing appointment. Don't put any lotion on the back and arms on the day of testing.  Plan on being here for 30-60 minutes.   Coughing Normal breathing test today. Monitor symptoms for now.   Follow up for skin testing.

## 2023-08-02 ENCOUNTER — Ambulatory Visit (HOSPITAL_COMMUNITY): Admit: 2023-08-02 | Payer: BC Managed Care – PPO | Admitting: General Surgery

## 2023-08-02 SURGERY — HERNIA REPAIR VENTRAL ADULT
Anesthesia: General

## 2023-08-02 NOTE — Progress Notes (Signed)
 Skin testing note  RE: George Mcneil MRN: [id number] DOB: 09-Aug-2000 Date of Office Visit: 08/03/2023  Referring provider: Valentin Skates, DO Primary care provider: Valentin Skates, DO  Chief Complaint: allergy  testing  History of Present Illness: I had the pleasure of seeing George Mcneil for a skin testing visit at the [facility] on 08/03/2023. He is a 23 y.o. male, who is being followed for allergic rhinoconjunctivitis and coughing. His previous allergy  office visit was on 07/27/2023 with Dr. Luke. Today is a skin testing visit.  He is accompanied today by his mother who provided/contributed to the history.   Discussed the use of AI scribe software for clinical note transcription with the patient, who gave verbal consent to proceed.  The patient reports an incident two years ago where he was stung by an unidentified insect, possibly a wasp or spider, during a walk. The sting resulted in significant localized swelling of the forearm, which was double the size of the unaffected arm. The patient sought medical attention at the time and was advised to purchase an EpiPen  as a precaution. He also took Benadryl  for the reaction. Since then, the patient has not experienced any additional stings.  The patient also reports ongoing sinus issues, including stuffiness and drainage. He has been under the care of an ENT specialist who has prescribed various nasal sprays, including Flonase. However, the patient admits to inconsistent use of these medications. He has also been using allergy  medications, which he reports are somewhat helpful for a runny nose but not for coughing.     Assessment and Plan: George Mcneil is a 23 y.o. male with: Chronic rhinitis Past history - Persistent symptoms of congestion, runny nose, occasional sneezing, and itchy nose. Symptoms are year-round with possible seasonal exacerbation in winter. Partial response to Zyrtec and Flonase. Follows with  ENT. Today's skin testing negative to indoor/outdoor allergens.  Follow up with ENT. Start dymista  (fluticasone  + azelastine  nasal spray combination) 1 spray per nostril twice a day. This replaces your other nasal sprays. If it's not covered let us  know.  Nasal saline spray (i.e., Simply Saline) or nasal saline lavage (i.e., NeilMed) is recommended as needed and prior to medicated nasal sprays. Use over the counter antihistamines such as Zyrtec (cetirizine), Claritin (loratadine), Allegra (fexofenadine), or Xyzal (levocetirizine) daily as needed. May switch antihistamines every few months.  Local reaction to hymenoptera sting Large localized reaction 2 years ago. Continue to avoid - handout given. Get bloodwork.  Chronic coughing Past history - on and off cough for several years, sometimes associated with chest congestion. No clear triggers identified. No prior asthma diagnosis or inhaler use. 2025 spirometry was normal.  Monitor symptoms.    Return if symptoms worsen or fail to improve.  Meds ordered this encounter  Medications   Azelastine -Fluticasone  137-50 MCG/ACT SUSP    Sig: Place 1 spray into the nose in the morning and at bedtime.    Dispense:  23 g    Refill:  5   Lab Orders         Hymenoptera Venom Allergy  II      Diagnostics: Skin Testing: Environmental allergy  panel. Today's skin testing negative to indoor/outdoor allergens.  Results discussed with patient/family.  Airborne Adult Perc - 08/03/23 1053     Time Antigen Placed 1050    Allergen Manufacturer Jestine    Location Back    Number of Test 55    1. Control-Buffer 50% Glycerol Negative    2.  Control-Histamine 2+    3. Bahia Negative    4. Bermuda Negative    5. Johnson Negative    6. Kentucky  Blue Negative    7. Meadow Fescue Negative    8. Perennial Rye Negative    9. Timothy Negative    10. Ragweed Mix Negative    11. Cocklebur Negative    12. Plantain,  English Negative    13. Baccharis Negative     14. Dog Fennel Negative    15. Russian Thistle Negative    16. Lamb's Quarters Negative    17. Sheep Sorrell Negative    18. Rough Pigweed Negative    19. Marsh Elder, Rough Negative    20. Mugwort, Common Negative    21. Box, Elder Negative    22. Cedar, red Negative    23. Sweet Gum Negative    24. Pecan Pollen Negative    25. Pine Mix Negative    26. Walnut, Black Pollen Negative    27. Red Mulberry Negative    28. Ash Mix Negative    29. Birch Mix Negative    30. Beech American Negative    31. Cottonwood, Eastern Negative    32. Hickory, White Negative    33. Maple Mix Negative    34. Oak, Eastern Mix Negative    35. Sycamore Eastern Negative    36. Alternaria Alternata Negative    37. Cladosporium Herbarum Negative    38. Aspergillus Mix Negative    39. Penicillium Mix Negative    40. Bipolaris Sorokiniana (Helminthosporium) Negative    41. Drechslera Spicifera (Curvularia) Negative    42. Mucor Plumbeus Negative    43. Fusarium Moniliforme Negative    44. Aureobasidium Pullulans (pullulara) Negative    45. Rhizopus Oryzae Negative    46. Botrytis Cinera Negative    47. Epicoccum Nigrum Negative    48. Phoma Betae Negative    49. Dust Mite Mix Negative    50. Cat Hair 10,000 BAU/ml Negative    51.  Dog Epithelia Negative    52. Mixed Feathers Negative    53. Horse Epithelia Negative    54. Cockroach, German Negative    55. Tobacco Leaf Negative             Intradermal - 08/03/23 1142     Time Antigen Placed 1140    Allergen Manufacturer Greer    Location Arm    Number of Test 16    Control Negative    Bahia Negative    Bermuda Negative    Johnson Negative    7 Grass Negative    Ragweed Mix Negative    Weed Mix Negative    Tree Mix Negative    Mold 1 Negative    Mold 2 Negative    Mold 3 Negative    Mold 4 Negative    Mite Mix Negative    Cat Negative    Dog Negative    Cockroach Negative             Previous notes and tests were  reviewed. The plan was reviewed with the patient/family, and all questions/concerned were addressed.  It was my pleasure to see George Mcneil today and participate in his care. Please feel free to contact me with any questions or concerns.  Sincerely,  Orlan Cramp, DO Allergy  & Immunology  [facility]   [facility] office: [phone] [facility] office: [phone]

## 2023-08-03 ENCOUNTER — Other Ambulatory Visit (HOSPITAL_COMMUNITY): Payer: Self-pay

## 2023-08-03 ENCOUNTER — Telehealth: Payer: Self-pay

## 2023-08-03 ENCOUNTER — Ambulatory Visit: Payer: BC Managed Care – PPO | Admitting: Allergy

## 2023-08-03 ENCOUNTER — Other Ambulatory Visit: Payer: Self-pay

## 2023-08-03 ENCOUNTER — Encounter: Payer: Self-pay | Admitting: Allergy

## 2023-08-03 DIAGNOSIS — T63481D Toxic effect of venom of other arthropod, accidental (unintentional), subsequent encounter: Secondary | ICD-10-CM

## 2023-08-03 DIAGNOSIS — J310 Chronic rhinitis: Secondary | ICD-10-CM | POA: Diagnosis not present

## 2023-08-03 DIAGNOSIS — R053 Chronic cough: Secondary | ICD-10-CM

## 2023-08-03 DIAGNOSIS — T63481A Toxic effect of venom of other arthropod, accidental (unintentional), initial encounter: Secondary | ICD-10-CM

## 2023-08-03 MED ORDER — AZELASTINE-FLUTICASONE 137-50 MCG/ACT NA SUSP
1.0000 | Freq: Two times a day (BID) | NASAL | 5 refills | Status: AC
Start: 2023-08-03 — End: ?

## 2023-08-03 NOTE — Patient Instructions (Addendum)
 Today's skin testing negative to indoor/outdoor allergens.   Results given.  Rhinitis  Follow up with ENT. Start dymista  (fluticasone  + azelastine  nasal spray combination) 1 spray per nostril twice a day. This replaces your other nasal sprays. If it's not covered let us  know.  Nasal saline spray (i.e., Simply Saline) or nasal saline lavage (i.e., NeilMed) is recommended as needed and prior to medicated nasal sprays. Use over the counter antihistamines such as Zyrtec (cetirizine), Claritin (loratadine), Allegra (fexofenadine), or Xyzal (levocetirizine) daily as needed. May switch antihistamines every few months.  Coughing Monitor symptoms for now.   Insect sting Continue to avoid. Get bloodwork We are ordering labs, so please allow 1-2 weeks for the results to come back. With the newly implemented Cures Act, the labs might be visible to you at the same time that they become visible to me. However, I will not address the results until all of the results are back, so please be patient.  In the meantime, continue recommendations in your patient instructions, including avoidance measures (if applicable), until you hear from me.  Return if symptoms worsen or fail to improve. Or sooner if needed.

## 2023-08-03 NOTE — Telephone Encounter (Signed)
*  Asthma/Allergy   Pharmacy Patient Advocate Encounter   Received notification from CoverMyMeds that prior authorization for Azelastine -Fluticasone  137-50MCG/ACT suspension  is required/requested.   Insurance verification completed.   The patient is insured through [facility] .   Per test claim: PA required; PA submitted to above mentioned insurance via CoverMyMeds Key/confirmation #/EOC [id number] Status is pending

## 2023-08-05 MED ORDER — AZELASTINE HCL 0.1 % NA SOLN: 1.0000 | Freq: Two times a day (BID) | NASAL | 5 refills | Status: AC | PRN

## 2023-08-05 NOTE — Telephone Encounter (Signed)
Pt informed and stated understanding

## 2023-08-05 NOTE — Telephone Encounter (Signed)
[employer] [place] prior authorization team called stating the prior authorization for Azelastine-Fluticasone has been denied. The prior auth team states he needs to try and fail two over the counter medications.

## 2023-08-05 NOTE — Addendum Note (Signed)
Addended by: Ellamae Sia on: 08/05/2023 02:20 PM   Modules accepted: Orders

## 2023-08-05 NOTE — Telephone Encounter (Signed)
Please call patient of denial for generic dymista.   I'm sending in  Use azelastine nasal spray 1-2 sprays per nostril twice a day as needed for runny nose/drainage.  And he needs to try either of these 2 over the counter nasal sprays:   Use Nasacort (triamcinolone) Or Nasonex  nasal spray 1 spray per nostril twice a day as needed for nasal congestion.

## 2023-08-10 DIAGNOSIS — Z9889 Other specified postprocedural states: Secondary | ICD-10-CM | POA: Diagnosis not present

## 2023-08-10 DIAGNOSIS — Z8719 Personal history of other diseases of the digestive system: Secondary | ICD-10-CM | POA: Diagnosis not present

## 2023-10-01 DIAGNOSIS — N200 Calculus of kidney: Secondary | ICD-10-CM | POA: Diagnosis not present

## 2023-10-01 DIAGNOSIS — R319 Hematuria, unspecified: Secondary | ICD-10-CM | POA: Diagnosis not present

## 2023-10-01 DIAGNOSIS — R300 Dysuria: Secondary | ICD-10-CM | POA: Diagnosis not present

## 2023-10-01 DIAGNOSIS — R809 Proteinuria, unspecified: Secondary | ICD-10-CM | POA: Diagnosis not present

## 2023-10-06 DIAGNOSIS — N132 Hydronephrosis with renal and ureteral calculous obstruction: Secondary | ICD-10-CM | POA: Diagnosis not present

## 2023-10-06 DIAGNOSIS — R109 Unspecified abdominal pain: Secondary | ICD-10-CM | POA: Diagnosis not present

## 2023-10-06 DIAGNOSIS — R310 Gross hematuria: Secondary | ICD-10-CM | POA: Diagnosis not present

## 2023-10-06 DIAGNOSIS — N200 Calculus of kidney: Secondary | ICD-10-CM | POA: Diagnosis not present

## 2023-10-07 ENCOUNTER — Other Ambulatory Visit: Payer: Self-pay | Admitting: Urology

## 2023-10-07 ENCOUNTER — Encounter (HOSPITAL_COMMUNITY): Payer: Self-pay | Admitting: Urology

## 2023-10-07 NOTE — Progress Notes (Signed)
 Spoke w/ via phone for pre-op interview---George Mcneil needs dos---- KUB              COVID test -----patient states asymptomatic no test needed Arrive at -------0600 NPO after MN NO Solid Food.  Clear liquids from MN until--- Med rec completed. Pt aware to hold ASA/NSAIDs and supplements per [facility] protocol.  Medications to take morning of surgery -----pain med/nausea med as needed Diabetic/Weight loss medication ----- No Alcohol or recreational drugs for 24 hours/Tobacco products for 6 hours ---- Patient instructed to bring blue lithotripsy folder, photo id and insurance card day of surgery. Patient aware to have Driver (ride ) / caregiver for 24 hours after surgery -----father George Mcneil Patient Special Instructions ----- Pre-Op special Instructions ----- take laxative of choice day before procedure Patient verbalized understanding of instructions that were given at this phone interview. Patient denies shortness of breath, chest pain, fever, cough at this phone interview.

## 2023-10-11 ENCOUNTER — Ambulatory Visit (HOSPITAL_COMMUNITY)
Admission: RE | Admit: 2023-10-11 | Discharge: 2023-10-11 | Disposition: A | Discharge status: Home / Self Care | Attending: Urology | Admitting: Urology

## 2023-10-11 ENCOUNTER — Encounter (HOSPITAL_COMMUNITY): Payer: Self-pay | Admitting: Urology

## 2023-10-11 ENCOUNTER — Encounter (HOSPITAL_COMMUNITY)
Admission: RE | Disposition: A | Discharge status: Home / Self Care | Payer: Self-pay | Source: Home / Self Care | Attending: Urology

## 2023-10-11 ENCOUNTER — Other Ambulatory Visit: Payer: Self-pay

## 2023-10-11 ENCOUNTER — Ambulatory Visit (HOSPITAL_COMMUNITY)

## 2023-10-11 DIAGNOSIS — N200 Calculus of kidney: Secondary | ICD-10-CM

## 2023-10-11 DIAGNOSIS — N202 Calculus of kidney with calculus of ureter: Secondary | ICD-10-CM | POA: Diagnosis not present

## 2023-10-11 DIAGNOSIS — N201 Calculus of ureter: Secondary | ICD-10-CM | POA: Diagnosis not present

## 2023-10-11 HISTORY — DX: Personal history of urinary calculi: Z87.442

## 2023-10-11 SURGERY — LITHOTRIPSY, ESWL
Anesthesia: LOCAL | Laterality: Right

## 2023-10-11 MED ORDER — CIPROFLOXACIN HCL 500 MG PO TABS
500.0000 mg | ORAL_TABLET | ORAL | Status: AC
  Administered 2023-10-11: 500 mg via ORAL
  Filled 2023-10-11: qty 1

## 2023-10-11 MED ORDER — SODIUM CHLORIDE 0.9 % IV SOLN: INTRAVENOUS | Status: DC

## 2023-10-11 MED ORDER — OXYCODONE-ACETAMINOPHEN 5-325 MG PO TABS: 1.0000 | ORAL_TABLET | ORAL | 0 refills | Status: AC | PRN

## 2023-10-11 MED ORDER — DIPHENHYDRAMINE HCL 25 MG PO CAPS
25.0000 mg | ORAL_CAPSULE | ORAL | Status: AC
Start: 2023-10-11 — End: 2023-10-11
  Administered 2023-10-11: 25 mg via ORAL
  Filled 2023-10-11: qty 1

## 2023-10-11 MED ORDER — DIAZEPAM 5 MG PO TABS
10.0000 mg | ORAL_TABLET | ORAL | Status: AC
  Administered 2023-10-11: 10 mg via ORAL
  Filled 2023-10-11: qty 2

## 2023-10-11 NOTE — Op Note (Signed)
[facility] Operative Note  Treating Physician: Tennessee Perra, MD  Pre-op diagnosis: Right renal stone  Post-op diagnosis: Same   Procedure: Right ESWL  See [facility] OP note scanned into chart. Also because of the size, density, location and other factors that cannot be anticipated I feel this will likely be a staged procedure. This fact supersedes any indication in the scanned [facility] operative note to the contrary.  Matt R. Tomothy Eddins MD [facility]  Pager: [phone]   

## 2023-10-11 NOTE — H&P (Signed)
 Urology Preoperative H&P   Chief Complaint: Right renal stones  History of Present Illness: George Mcneil is a 23 y.o. male with right renal stones here for right ESWL. Denies fevers, chills, dysuria.    Past Medical History:  Diagnosis Date   Anxiety    History of kidney stones     Past Surgical History:  Procedure Laterality Date   HERNIA REPAIR     TONSILLECTOMY AND ADENOIDECTOMY Bilateral 07/08/2020   Procedure: TONSILLECTOMY AND ADENOIDECTOMY;  Surgeon: Newman Pies, MD;  Location: [facility];  Service: ENT;  Laterality: Bilateral;   WISDOM TOOTH EXTRACTION      Allergies: No Known Allergies  Family History  Problem Relation Age of Onset   Allergic rhinitis Neg Hx    Angioedema Neg Hx    Asthma Neg Hx    Atopy Neg Hx    Eczema Neg Hx    Immunodeficiency Neg Hx    Urticaria Neg Hx     Social History:  reports that he has never smoked. He has never been exposed to tobacco smoke. He has never used smokeless tobacco. He reports current alcohol use. He reports that he does not currently use drugs.  ROS: A complete review of systems was performed.  All systems are negative except for pertinent findings as noted.  Physical Exam:  Vital signs in last 24 hours: Temp:  [98 F (36.7 C)] 98 F (36.7 C) (03/24 0601) Pulse Rate:  [75] 75 (03/24 0601) Resp:  [16] 16 (03/24 0601) BP: (131)/(79) 131/79 (03/24 0601) SpO2:  [100 %] 100 % (03/24 0601) Weight:  [97 kg] 97 kg (03/24 0646) Constitutional:  Alert and oriented, No acute distress Cardiovascular: Regular rate and rhythm Respiratory: Normal respiratory effort, Lungs clear bilaterally GI: Abdomen is soft, nontender, nondistended, no abdominal masses GU: No CVA tenderness Lymphatic: No lymphadenopathy Neurologic: Grossly intact, no focal deficits Psychiatric: Normal mood and affect  Laboratory Data:  No results for input(s): "WBC", "HGB", "HCT", "PLT" in the last 72 hours.  No results for  input(s): "NA", "K", "CL", "GLUCOSE", "BUN", "CALCIUM", "CREATININE" in the last 72 hours.  Invalid input(s): "CO3"   No results found for this or any previous visit (from the past 24 hours). No results found for this or any previous visit (from the past 240 hours).  Renal Function: No results for input(s): "CREATININE" in the last 168 hours. CrCl cannot be calculated (No successful lab value found.).  Radiologic Imaging: No results found.  I independently reviewed the above imaging studies.  Assessment and Plan George Mcneil is a 23 y.o. male with right renal stones here for right ESWL.  The risks, benefits and alternatives of right ESWL was discussed with the patient. I described the risks which include arrhythmia, kidney contusion, kidney hemorrhage, need for transfusion, back discomfort, flank ecchymosis, flank abrasion, inability to fracture the stone, inability to pass stone fragments, Steinstrasse, infection associated with obstructing stones, need for an alternative surgical procedure and possible need for repeat shockwave lithotripsy.  The patient voices understanding and wishes to proceed.    Matt R. Natayah Warmack MD 10/11/2023, 7:50 AM  [facility] Pager: [phone]): [phone]

## 2023-10-11 NOTE — Discharge Instructions (Addendum)
 Activity:  You are encouraged to ambulate frequently (about every hour during waking hours) to help prevent blood clots from forming in your legs or lungs.  However, you should not engage in any heavy lifting (> 10-15 lbs), strenuous activity, or straining.  Diet: You should advance your diet as instructed by your physician.  It will be normal to have some bloating, nausea, and abdominal discomfort intermittently.  Prescriptions:  You will be provided a prescription for pain medication to take as needed.  If your pain is not severe enough to require the prescription pain medication, you may take extra strength Tylenol instead which will have less side effects.  You should also take a prescribed stool softener to avoid straining with bowel movements as the prescription pain medication may constipate you. What to call us about: You should call the office [phone]) if you develop fever > 101 or develop persistent vomiting. Activity:  You are encouraged to ambulate frequently (about every hour during waking hours) to help prevent blood clots from forming in your legs or lungs.  However, you should not engage in any heavy lifting (> 10-15 lbs), strenuous activity, or straining.   Post Anesthesia Home Care Instructions  Activity: Get plenty of rest for the remainder of the day. A responsible individual must stay with you for 24 hours following the procedure.  For the next 24 hours, DO NOT: -Drive a car -Advertising copywriter -Drink alcoholic beverages -Take any medication unless instructed by your physician -Make any legal decisions or sign important papers.  Meals: Start with liquid foods such as gelatin or soup. Progress to regular foods as tolerated. Avoid greasy, spicy, heavy foods. If nausea and/or vomiting occur, drink only clear liquids until the nausea and/or vomiting subsides. Call your physician if vomiting continues.  Special Instructions/Symptoms: Your throat may feel dry or sore from  the anesthesia or the breathing tube placed in your throat during surgery. If this causes discomfort, gargle with warm salt water. The discomfort should disappear within 24 hours.

## 2023-10-12 ENCOUNTER — Encounter (HOSPITAL_COMMUNITY): Payer: Self-pay | Admitting: Urology

## 2023-11-02 DIAGNOSIS — R1084 Generalized abdominal pain: Secondary | ICD-10-CM | POA: Diagnosis not present

## 2023-11-02 DIAGNOSIS — N200 Calculus of kidney: Secondary | ICD-10-CM | POA: Diagnosis not present

## 2024-03-24 DIAGNOSIS — H1045 Other chronic allergic conjunctivitis: Secondary | ICD-10-CM | POA: Diagnosis not present

## 2024-06-13 DIAGNOSIS — S39012A Strain of muscle, fascia and tendon of lower back, initial encounter: Secondary | ICD-10-CM | POA: Diagnosis not present

## 2024-06-22 DIAGNOSIS — M546 Pain in thoracic spine: Secondary | ICD-10-CM | POA: Diagnosis not present

## 2024-06-22 DIAGNOSIS — M545 Low back pain, unspecified: Secondary | ICD-10-CM | POA: Diagnosis not present

## 2024-06-27 DIAGNOSIS — M546 Pain in thoracic spine: Secondary | ICD-10-CM | POA: Diagnosis not present

## 2024-06-27 DIAGNOSIS — M545 Low back pain, unspecified: Secondary | ICD-10-CM | POA: Diagnosis not present

## 2024-07-03 DIAGNOSIS — M546 Pain in thoracic spine: Secondary | ICD-10-CM | POA: Diagnosis not present

## 2024-07-03 DIAGNOSIS — M545 Low back pain, unspecified: Secondary | ICD-10-CM | POA: Diagnosis not present

## 2024-07-05 DIAGNOSIS — M545 Low back pain, unspecified: Secondary | ICD-10-CM | POA: Diagnosis not present

## 2024-07-05 DIAGNOSIS — M546 Pain in thoracic spine: Secondary | ICD-10-CM | POA: Diagnosis not present
